# Patient Record
Sex: Female | Born: 1980 | Race: White | Hispanic: No | Marital: Single | Smoking: Current every day smoker
Health system: Southern US, Community
[De-identification: ages and names within clinical notes are randomized; demographics above are authoritative.]

## PROBLEM LIST (undated history)

## (undated) DIAGNOSIS — K219 Gastro-esophageal reflux disease without esophagitis: Secondary | ICD-10-CM

## (undated) DIAGNOSIS — K08109 Complete loss of teeth, unspecified cause, unspecified class: Secondary | ICD-10-CM

## (undated) DIAGNOSIS — Z972 Presence of dental prosthetic device (complete) (partial): Secondary | ICD-10-CM

## (undated) DIAGNOSIS — E0500 Thyrotoxicosis with diffuse goiter without thyrotoxic crisis or storm: Secondary | ICD-10-CM

## (undated) DIAGNOSIS — J45909 Unspecified asthma, uncomplicated: Secondary | ICD-10-CM

## (undated) DIAGNOSIS — M779 Enthesopathy, unspecified: Secondary | ICD-10-CM

## (undated) HISTORY — PX: MULTIPLE TOOTH EXTRACTIONS: SHX2053

## (undated) HISTORY — PX: DILATION AND CURETTAGE OF UTERUS: SHX78

---

## 2005-04-30 ENCOUNTER — Emergency Department: Payer: Self-pay | Admitting: Emergency Medicine

## 2005-12-31 ENCOUNTER — Emergency Department: Payer: Self-pay | Admitting: Unknown Physician Specialty

## 2006-01-05 ENCOUNTER — Emergency Department: Payer: Self-pay | Admitting: Emergency Medicine

## 2006-02-22 ENCOUNTER — Emergency Department: Payer: Self-pay | Admitting: Emergency Medicine

## 2006-04-07 ENCOUNTER — Emergency Department: Payer: Self-pay | Admitting: Emergency Medicine

## 2007-05-01 ENCOUNTER — Emergency Department: Payer: Self-pay | Admitting: Emergency Medicine

## 2007-05-01 ENCOUNTER — Other Ambulatory Visit: Payer: Self-pay

## 2008-02-03 ENCOUNTER — Emergency Department: Payer: Self-pay | Admitting: Internal Medicine

## 2008-03-17 ENCOUNTER — Emergency Department: Payer: Self-pay | Admitting: Emergency Medicine

## 2008-10-03 ENCOUNTER — Emergency Department: Payer: Self-pay | Admitting: Emergency Medicine

## 2010-03-07 ENCOUNTER — Emergency Department: Payer: Self-pay | Admitting: Emergency Medicine

## 2010-03-08 ENCOUNTER — Emergency Department: Payer: Self-pay | Admitting: Internal Medicine

## 2010-10-19 IMAGING — US US OB < 14 WEEKS - US OB TV
1 series · 17 of 28 positions shown · non-contrast
Comparison: none

REASON FOR EXAM: vag bleed
COMMENTS:

[Series 1: us ob < 14 weeks - us ob tv · 17 of 69 slices shown]
[im 1/69]
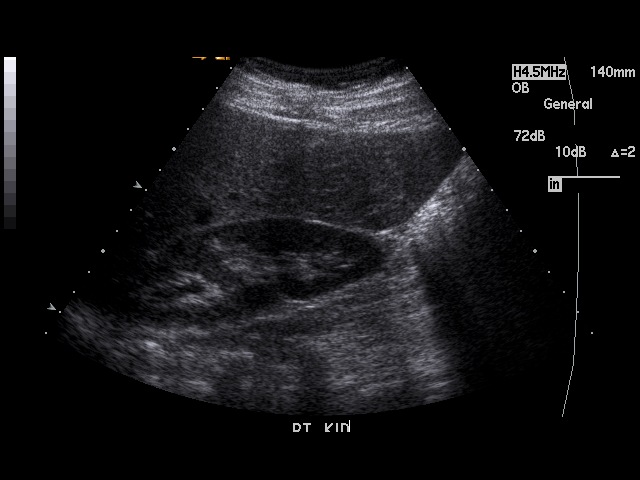
[im 6/69]
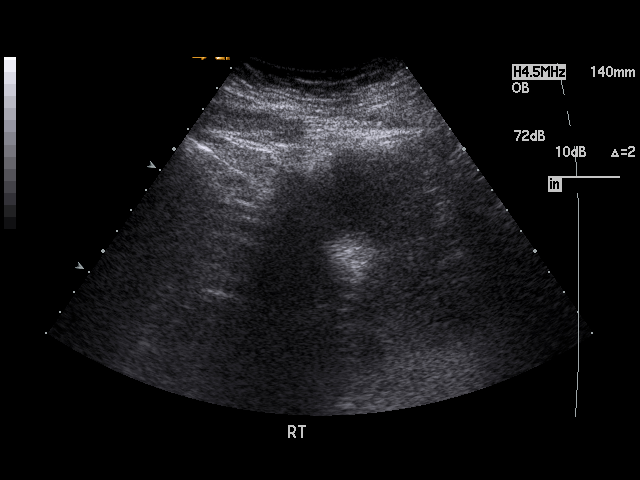
[im 11/69]
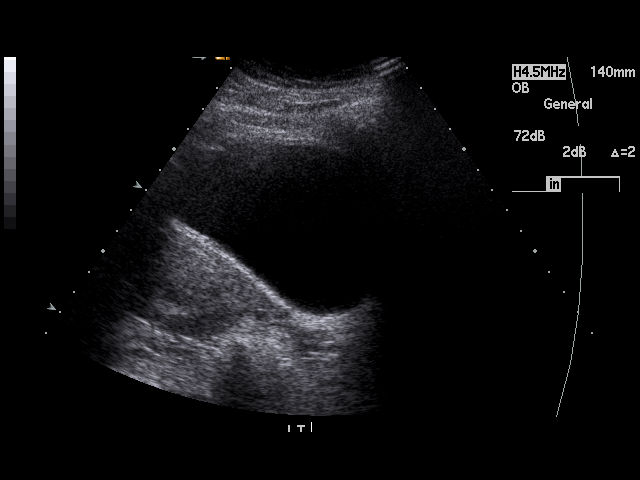
[im 13/69]
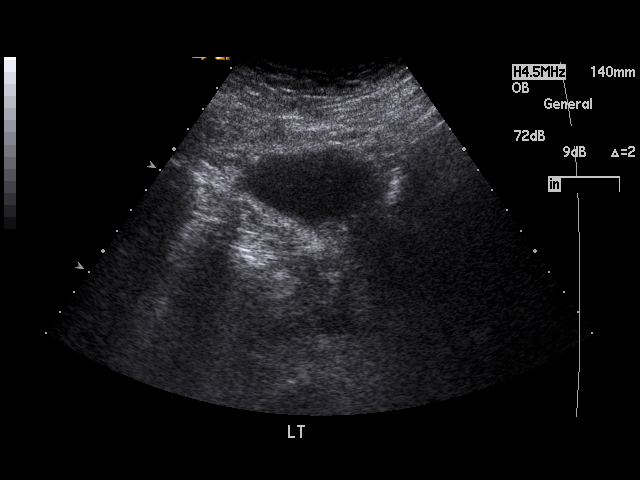
[im 18/69]
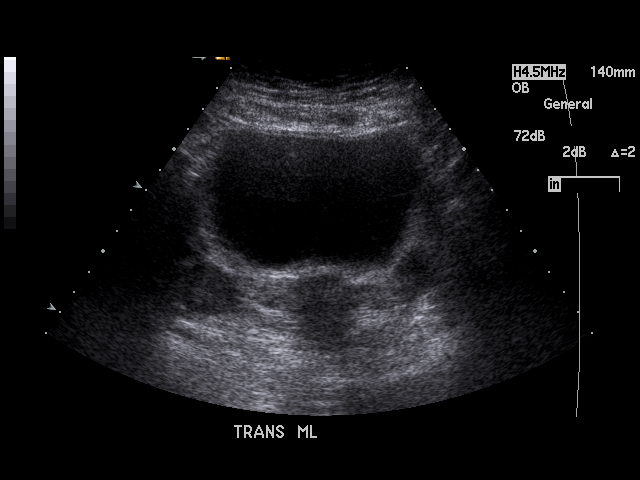
[im 23/69]
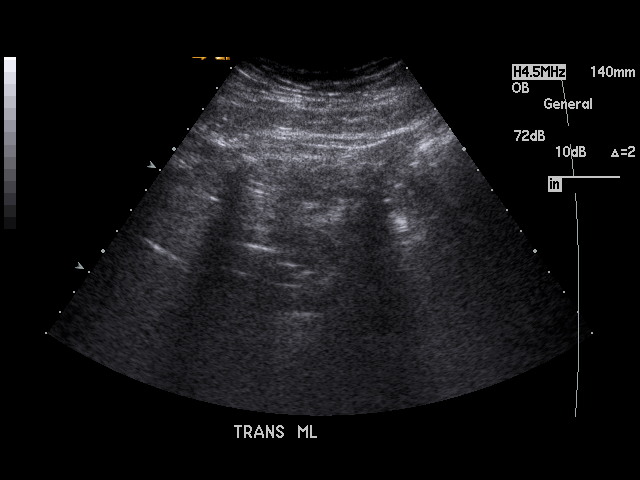
[im 26/69]
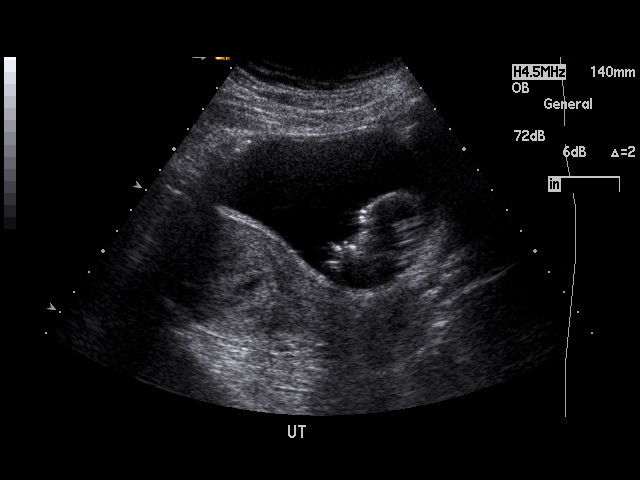
[im 31/69]
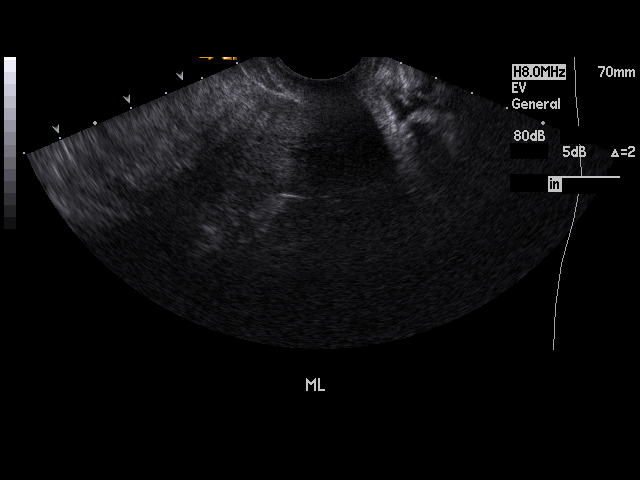
[im 36/69]
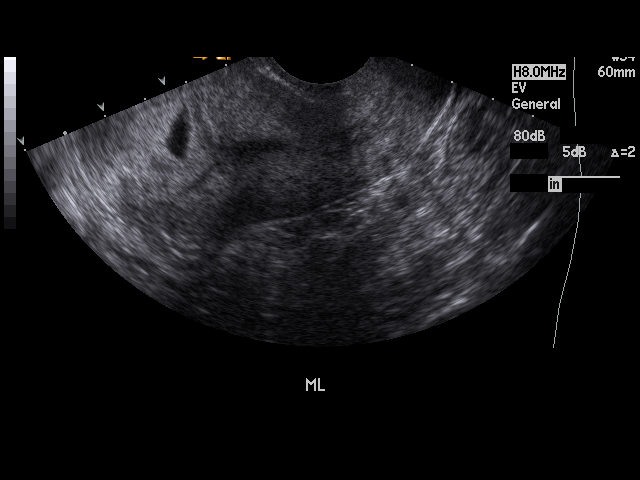
[im 38/69]
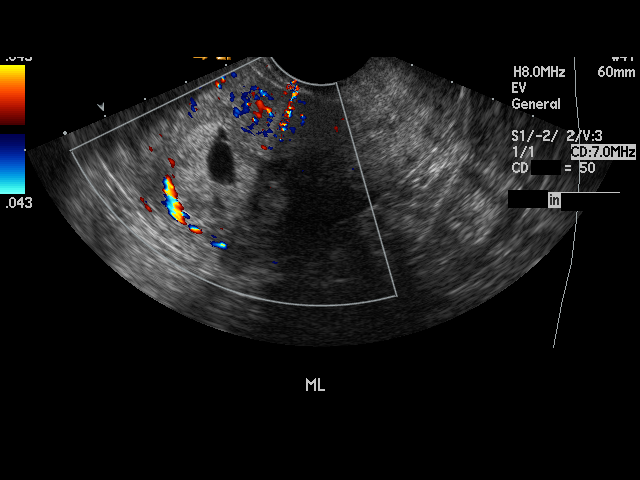
[im 43/69]
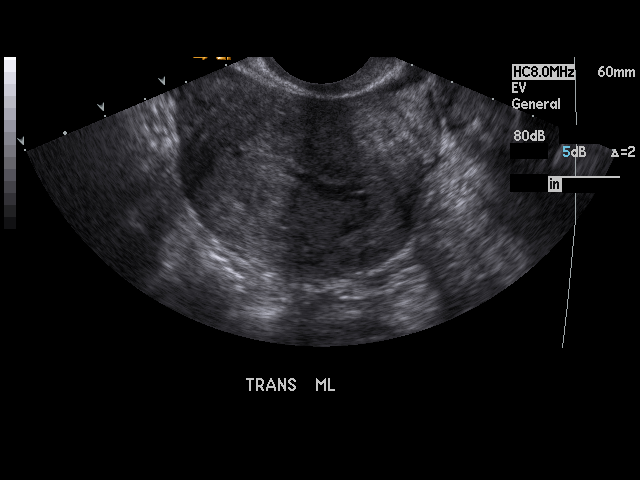
[im 46/69]
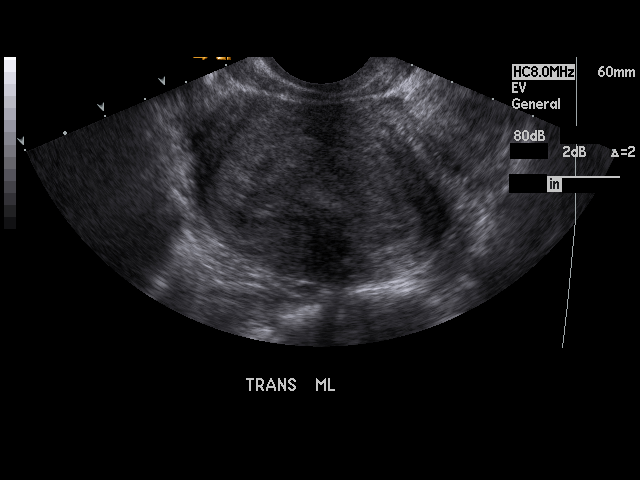
[im 51/69]
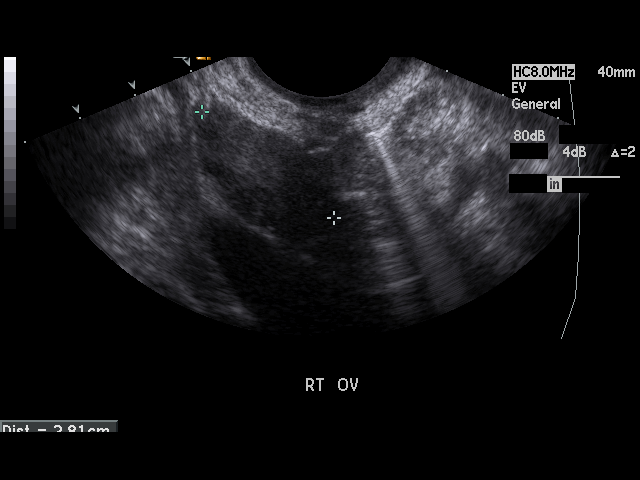
[im 56/69]
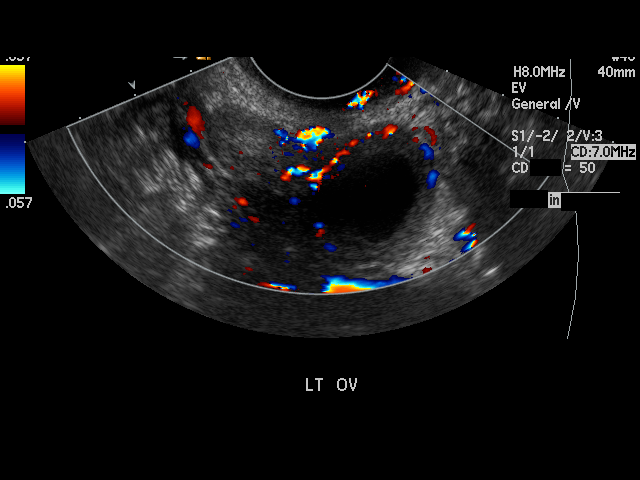
[im 58/69]
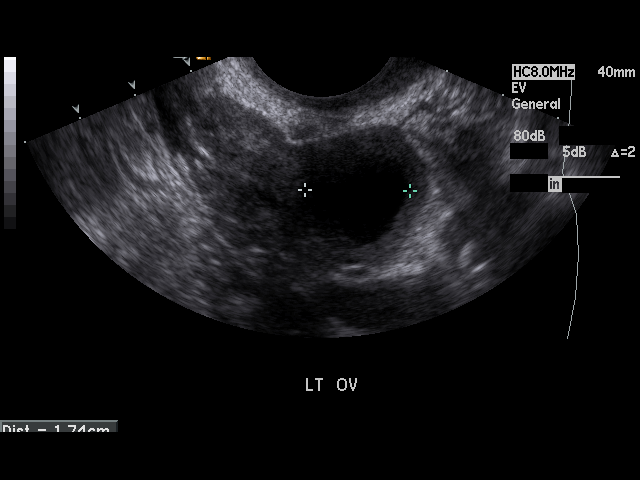
[im 63/69]
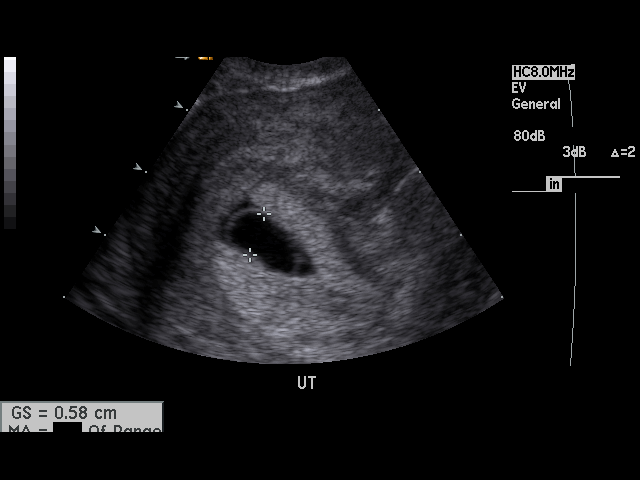
[im 69/69]
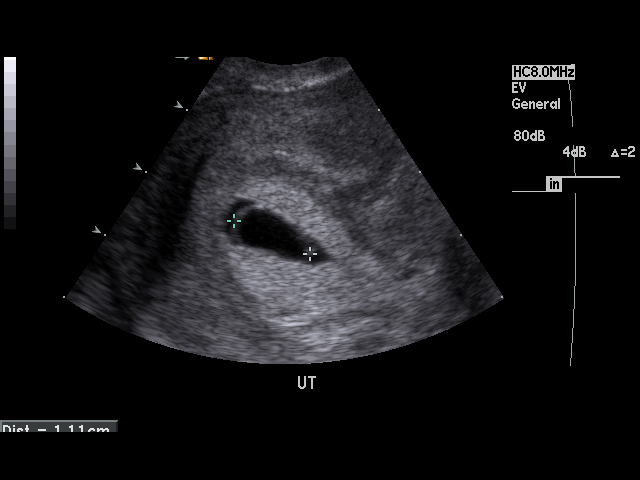

[17 of 28 positions shown; findings below may reference images not displayed]

PROCEDURE:     US  - US OB LESS THAN 14 WEEKS/W TRANS  - March 07, 2010 [DATE]

RESULT:     There is a cystic structure in the uterus which could represent
an intrauterine gestational sac of less than 5 weeks size. What appears to
represent a yolk sac is also seen and is slightly prominent for the size of
the gestational sac. No embryo within the gestational sac is seen. The
differential includes very early intrauterine gestation that is not yet
visualized, recent abortion or ectopic pregnancy with a pseudo-gestational
sac observed in the uterus. The right and left ovaries are visualized. There
is a 1.83 cm cyst of the left ovary. No free fluid is seen in the pelvis.
IMPRESSION: 1.  No intrauterine embryo is observed but there is noted what appears to
represent a less than 5 week size gestational sac. The differential is as
noted above.
2.  A left ovarian cyst is noted.
3.  No free fluid is noted in the pelvis.

## 2010-12-27 ENCOUNTER — Emergency Department: Payer: Self-pay | Admitting: Emergency Medicine

## 2011-04-12 ENCOUNTER — Emergency Department: Payer: Self-pay | Admitting: Emergency Medicine

## 2011-04-20 ENCOUNTER — Emergency Department: Payer: Self-pay | Admitting: Emergency Medicine

## 2011-10-30 ENCOUNTER — Inpatient Hospital Stay: Payer: Self-pay

## 2011-12-31 ENCOUNTER — Ambulatory Visit: Payer: Self-pay | Admitting: Obstetrics and Gynecology

## 2011-12-31 LAB — CBC
HGB: 12.4 g/dL (ref 12.0–16.0)
MCHC: 33.1 g/dL (ref 32.0–36.0)
Platelet: 276 10*3/uL (ref 150–440)
RBC: 4.18 10*6/uL (ref 3.80–5.20)
WBC: 7.9 10*3/uL (ref 3.6–11.0)

## 2011-12-31 LAB — PREGNANCY, URINE: Pregnancy Test, Urine: NEGATIVE m[IU]/mL

## 2012-01-01 ENCOUNTER — Ambulatory Visit: Payer: Self-pay | Admitting: Obstetrics and Gynecology

## 2012-01-05 LAB — PATHOLOGY REPORT

## 2012-03-20 ENCOUNTER — Emergency Department: Payer: Self-pay | Admitting: Emergency Medicine

## 2014-09-08 ENCOUNTER — Emergency Department: Payer: Self-pay | Admitting: Emergency Medicine

## 2015-04-01 NOTE — Op Note (Signed)
PATIENT NAME:  Holly Reynolds, [name] MR#:  [id number] DATE OF BIRTH:  1981-04-11  DATE OF PROCEDURE:  01/01/2012  PROCEDURE: Postpartum hysteroscopy D and C.  PREOPERATIVE DIAGNOSIS: Most likely retained placenta.   POSTOPERATIVE DIAGNOSIS: Most likely retained placenta.   ESTIMATED BLOOD LOSS: 50 mL.  SURGEON: Elliot Gurneyarrie C. Maysen Bonsignore, MD   PROCEDURE: The hymen was grasped and a side-opening speculum was placed into the vagina. The anterior lip of the cervix was grasped with a single-tooth tenaculum. The uterus was sounded to 8 cm. Cervix was dilated and then the camera with the dilator was placed and the aforementioned findings were seen after uterine distention. The hysteroscope was then removed. The suction and the curettage was then used to remove the implanted and calcified placenta from the lining. The patient tolerated the procedure well. Gritty feeling was felt on the uterus. Side-opening speculum and single-tooth tenaculum were removed. The patient tolerated the procedure well and was taken to the PAC-U.   ____________________________ Elliot Gurneyarrie C. Storie Heffern, MD cck:drc D: 01/01/2012 23:29:14 ET T: 01/02/2012 08:24:45 ET JOB#: [id number]  cc: Elliot Gurneyarrie C. Emry Tobin, MD, <Dictator> Elliot GurneyARRIE C Orella Cushman MD ELECTRONICALLY SIGNED 01/08/2012 7:42

## 2015-08-28 ENCOUNTER — Telehealth: Payer: Self-pay | Admitting: Gastroenterology

## 2015-08-28 NOTE — Telephone Encounter (Signed)
Left voice message for patient to call and schedule appointment for epigastric pain/loose stools one year with Dr. Servando Snare

## 2015-10-11 ENCOUNTER — Encounter: Payer: Self-pay | Admitting: Gastroenterology

## 2015-10-11 ENCOUNTER — Ambulatory Visit (INDEPENDENT_AMBULATORY_CARE_PROVIDER_SITE_OTHER): Payer: Medicaid Other | Admitting: Gastroenterology

## 2015-10-11 ENCOUNTER — Other Ambulatory Visit: Payer: Self-pay

## 2015-10-11 VITALS — BP 118/68 | HR 93 | Temp 98.4°F | Ht 64.0 in | Wt 135.0 lb

## 2015-10-11 DIAGNOSIS — G8929 Other chronic pain: Secondary | ICD-10-CM

## 2015-10-11 DIAGNOSIS — R1013 Epigastric pain: Secondary | ICD-10-CM | POA: Diagnosis not present

## 2015-10-11 MED ORDER — OMEPRAZOLE 40 MG PO CPDR: 40.0000 mg | DELAYED_RELEASE_CAPSULE | Freq: Every day | ORAL | Status: AC

## 2015-10-11 NOTE — Progress Notes (Signed)
Gastroenterology Consultation  Referring Provider:     No ref. provider found Primary Care Physician:  Imelda PillowHOLLAND, CHELSA, NP Primary Gastroenterologist:  Dr. Servando SnareWohl     Reason for Consultation:     Epigastric pain        HPI:   Holly Reynolds is a 34 y.o. y/o female referred for consultation & management of epigastric pain by Dr. Imelda PillowHOLLAND, CHELSA, NP.  This patient comes today reports that she's had months of epigastric pain. The patient underwent a ultrasound of her gallbladder which showed some sludge. There is no form stones but there was shadowing in the gallbladder. The patient reports that she has the symptoms a few times a month. She has been told to take a PPI in the past but from her past notes it appears that she has not done that. There is no report of any unexplained weight loss, nausea, fevers, chills, black stools or bloody stools. There is no family history of colon cancer colon polyps.  History reviewed. No pertinent past medical history.  History reviewed. No pertinent past surgical history.  Prior to Admission medications   Medication Sig Start Date End Date Taking? Authorizing Provider  omeprazole (PRILOSEC) 40 MG capsule Take 1 capsule (40 mg total) by mouth daily. 10/11/15   Midge Miniumarren Tristina Sahagian, MD    Family History  Problem Relation Age of Onset  . Arthritis Mother   . Hypertension Mother      Social History  Substance Use Topics  . Smoking status: Current Every Day Smoker  . Smokeless tobacco: Never Used  . Alcohol Use: No    Allergies as of 10/11/2015  . (No Known Allergies)    Review of Systems:    All systems reviewed and negative except where noted in HPI.   Physical Exam:  BP 118/68 mmHg  Pulse 93  Temp(Src) 98.4 F (36.9 C) (Oral)  Ht 5\' 4"  (1.626 m)  Wt 135 lb (61.236 kg)  BMI 23.16 kg/m2 No LMP recorded. Psych:  Alert and cooperative. Normal mood and affect. General:   Alert,  Well-developed, well-nourished, pleasant and cooperative in  NAD Head:  Normocephalic and atraumatic. Eyes:  Sclera clear, no icterus.   Conjunctiva pink. Ears:  Normal auditory acuity. Nose:  No deformity, discharge, or lesions. Mouth:  No deformity or lesions,oropharynx pink & moist. Neck:  Supple; no masses or thyromegaly. Lungs:  Respirations even and unlabored.  Clear throughout to auscultation.   No wheezes, crackles, or rhonchi. No acute distress. Heart:  Regular rate and rhythm; no murmurs, clicks, rubs, or gallops. Abdomen:  Normal bowel sounds.  No bruits.  Soft, mild epigastric tenderness and non-distended without masses, hepatosplenomegaly or hernias noted.  No guarding or rebound tenderness.  Negative Carnett sign.   Rectal:  Deferred.  Msk:  Symmetrical without gross deformities.  Good, equal movement & strength bilaterally. Pulses:  Normal pulses noted. Extremities:  No clubbing or edema.  No cyanosis. Neurologic:  Alert and oriented x3;  grossly normal neurologically. Skin:  Intact without significant lesions or rashes.  No jaundice. Lymph Nodes:  No significant cervical adenopathy. Psych:  Alert and cooperative. Normal mood and affect.  Imaging Studies: No results found.  Assessment and Plan:   Holly EveCatina L Verret is a 34 y.o. y/o female comes in today with epigastric pain that she states happens a few times a month. The patient will be put on a PPI daily. The patient will also be set up for an upper endoscopy due  to her symptoms and epigastric tenderness on her physical exam.I have discussed risks & benefits which include, but are not limited to, bleeding, infection, perforation & drug reaction.  The patient agrees with this plan & written consent will be obtained.      Note: This dictation was prepared with Dragon dictation along with smaller phrase technology. Any transcriptional errors that result from this process are unintentional.

## 2015-10-12 ENCOUNTER — Other Ambulatory Visit: Payer: Self-pay

## 2015-10-22 ENCOUNTER — Encounter: Payer: Self-pay | Admitting: *Deleted

## 2015-10-24 NOTE — Discharge Instructions (Signed)

## 2015-10-25 ENCOUNTER — Other Ambulatory Visit: Payer: Self-pay | Admitting: Gastroenterology

## 2015-10-25 ENCOUNTER — Ambulatory Visit: Payer: Medicaid Other | Admitting: Anesthesiology

## 2015-10-25 ENCOUNTER — Encounter
Admission: RE | Disposition: A | Discharge status: Home / Self Care | Payer: Self-pay | Source: Ambulatory Visit | Attending: Gastroenterology

## 2015-10-25 ENCOUNTER — Ambulatory Visit
Admission: RE | Admit: 2015-10-25 | Discharge: 2015-10-25 | Disposition: A | Discharge status: Home / Self Care | Payer: Medicaid Other | Source: Ambulatory Visit | Attending: Gastroenterology | Admitting: Gastroenterology

## 2015-10-25 DIAGNOSIS — R1013 Epigastric pain: Secondary | ICD-10-CM | POA: Diagnosis not present

## 2015-10-25 DIAGNOSIS — K219 Gastro-esophageal reflux disease without esophagitis: Secondary | ICD-10-CM | POA: Insufficient documentation

## 2015-10-25 DIAGNOSIS — K2950 Unspecified chronic gastritis without bleeding: Secondary | ICD-10-CM | POA: Diagnosis not present

## 2015-10-25 DIAGNOSIS — E0500 Thyrotoxicosis with diffuse goiter without thyrotoxic crisis or storm: Secondary | ICD-10-CM | POA: Insufficient documentation

## 2015-10-25 DIAGNOSIS — K2970 Gastritis, unspecified, without bleeding: Secondary | ICD-10-CM | POA: Insufficient documentation

## 2015-10-25 DIAGNOSIS — J45909 Unspecified asthma, uncomplicated: Secondary | ICD-10-CM | POA: Diagnosis not present

## 2015-10-25 DIAGNOSIS — Z8261 Family history of arthritis: Secondary | ICD-10-CM | POA: Diagnosis not present

## 2015-10-25 DIAGNOSIS — Z9889 Other specified postprocedural states: Secondary | ICD-10-CM | POA: Insufficient documentation

## 2015-10-25 DIAGNOSIS — Z8249 Family history of ischemic heart disease and other diseases of the circulatory system: Secondary | ICD-10-CM | POA: Insufficient documentation

## 2015-10-25 DIAGNOSIS — F17210 Nicotine dependence, cigarettes, uncomplicated: Secondary | ICD-10-CM | POA: Insufficient documentation

## 2015-10-25 DIAGNOSIS — Z79899 Other long term (current) drug therapy: Secondary | ICD-10-CM | POA: Diagnosis not present

## 2015-10-25 HISTORY — PX: ESOPHAGOGASTRODUODENOSCOPY (EGD) WITH PROPOFOL: SHX5813

## 2015-10-25 HISTORY — DX: Complete loss of teeth, unspecified cause, unspecified class: K08.109

## 2015-10-25 HISTORY — DX: Unspecified asthma, uncomplicated: J45.909

## 2015-10-25 HISTORY — DX: Gastro-esophageal reflux disease without esophagitis: K21.9

## 2015-10-25 HISTORY — DX: Enthesopathy, unspecified: M77.9

## 2015-10-25 HISTORY — DX: Complete loss of teeth, unspecified cause, unspecified class: Z97.2

## 2015-10-25 HISTORY — DX: Thyrotoxicosis with diffuse goiter without thyrotoxic crisis or storm: E05.00

## 2015-10-25 SURGERY — ESOPHAGOGASTRODUODENOSCOPY (EGD) WITH PROPOFOL
Anesthesia: Monitor Anesthesia Care | Wound class: Clean Contaminated

## 2015-10-25 MED ORDER — LACTATED RINGERS IV SOLN
INTRAVENOUS | Status: DC
  Administered 2015-10-25: 09:00:00 via INTRAVENOUS

## 2015-10-25 MED ORDER — ACETAMINOPHEN 325 MG PO TABS: 325.0000 mg | ORAL_TABLET | ORAL | Status: DC | PRN

## 2015-10-25 MED ORDER — PROPOFOL 10 MG/ML IV BOLUS
INTRAVENOUS | Status: DC | PRN
  Administered 2015-10-25: 100 mg via INTRAVENOUS
  Administered 2015-10-25: 50 mg via INTRAVENOUS

## 2015-10-25 MED ORDER — LIDOCAINE HCL (CARDIAC) 20 MG/ML IV SOLN
INTRAVENOUS | Status: DC | PRN
  Administered 2015-10-25: 20 mg via INTRAVENOUS

## 2015-10-25 MED ORDER — ACETAMINOPHEN 160 MG/5ML PO SOLN: 325.0000 mg | ORAL | Status: DC | PRN

## 2015-10-25 MED ORDER — STERILE WATER FOR IRRIGATION IR SOLN
Status: DC | PRN
  Administered 2015-10-25: 09:00:00

## 2015-10-25 SURGICAL SUPPLY — 39 items

## 2015-10-25 NOTE — Op Note (Signed)
[facility] Gastroenterology Patient Name: Holly SchleinCatina Reynolds Procedure Date: 10/25/2015 8:50 AM MRN: [id number] Account #: [id number] Date of Birth: 11/26/1981 Admit Type: Outpatient Age: 34 [facility] 01 Gender: Female Note Status: Finalized Procedure:         Upper GI endoscopy Indications:       Epigastric abdominal pain Providers:         Midge Miniumarren Daneil Beem, MD Referring MD:      Resa Minerhelsa B. Marcelle OverlieHolland (Referring MD) Medicines:         Propofol per Anesthesia Complications:     No immediate complications. Procedure:         Pre-Anesthesia Assessment:                    - Prior to the procedure, a History and Physical was                     performed, and patient medications and allergies were                     reviewed. The patient's tolerance of previous anesthesia                     was also reviewed. The risks and benefits of the procedure                     and the sedation options and risks were discussed with the                     patient. All questions were answered, and informed consent                     was obtained. Prior Anticoagulants: The patient has taken                     no previous anticoagulant or antiplatelet agents. ASA                     Grade Assessment: II - A patient with mild systemic                     disease. After reviewing the risks and benefits, the                     patient was deemed in satisfactory condition to undergo                     the procedure.                    After obtaining informed consent, the endoscope was passed                     under direct vision. Throughout the procedure, the                     patient's blood pressure, pulse, and oxygen saturations                     were monitored continuously. The Olympus GIF H180J                     endoscope (S#: [id number]) was introduced through the mouth,  and advanced to the second part of duodenum. The upper GI   endoscopy was accomplished without difficulty. The patient                     tolerated the procedure well. Findings:      The examined esophagus was normal.      Localized minimal inflammation characterized by erythema was found in       the gastric antrum. Biopsies were taken with a cold forceps for       histology.      The examined duodenum was normal. Impression:        - Normal esophagus.                    - Gastritis. Biopsied.                    - Normal examined duodenum. Recommendation:    - Await pathology results. Procedure Code(s): --- Professional ---                    [phone], Esophagogastroduodenoscopy, flexible, transoral;                     with biopsy, single or multiple Diagnosis Code(s): --- Professional ---                    R10.13, Epigastric pain                    K29.70, Gastritis, unspecified, without bleeding CPT copyright 2014 American Medical Association. All rights reserved. The codes documented in this report are preliminary and upon coder review may  be revised to meet current compliance requirements. Midge Minium, MD 10/25/2015 9:04:07 AM This report has been signed electronically. Number of Addenda: 0 Note Initiated On: 10/25/2015 8:50 AM Total Procedure Duration: 0 hours 2 minutes 36 seconds       [facility]

## 2015-10-25 NOTE — Transfer of Care (Signed)
Immediate Anesthesia Transfer of Care Note  Patient: Holly Reynolds  Procedure(s) Performed: Procedure(s): ESOPHAGOGASTRODUODENOSCOPY (EGD) WITH PROPOFOL (N/A)  Patient Location: PACU  Anesthesia Type: MAC  Level of Consciousness: awake, alert  and patient cooperative  Airway and Oxygen Therapy: Patient Spontanous Breathing and Patient connected to supplemental oxygen  Post-op Assessment: Post-op Vital signs reviewed, Patient's Cardiovascular Status Stable, Respiratory Function Stable, Patent Airway and No signs of Nausea or vomiting  Post-op Vital Signs: Reviewed and stable  Complications: No apparent anesthesia complications

## 2015-10-25 NOTE — Anesthesia Procedure Notes (Signed)
Procedure Name: MAC Performed by: Darrin Koman Pre-anesthesia Checklist: Patient identified, Emergency Drugs available, Suction available, Patient being monitored and Timeout performed Patient Re-evaluated:Patient Re-evaluated prior to inductionOxygen Delivery Method: Nasal cannula       

## 2015-10-25 NOTE — Anesthesia Postprocedure Evaluation (Signed)
  Anesthesia Post-op Note  Patient: Holly Reynolds  Procedure(s) Performed: Procedure(s): ESOPHAGOGASTRODUODENOSCOPY (EGD) WITH PROPOFOL (N/A)  Anesthesia type:MAC  Patient location: PACU  Post pain: Pain level controlled  Post assessment: Post-op Vital signs reviewed, Patient's Cardiovascular Status Stable, Respiratory Function Stable, Patent Airway and No signs of Nausea or vomiting  Post vital signs: Reviewed and stable  Last Vitals:  Filed Vitals:   10/25/15 0909  BP: 94/59  Pulse: 76  Temp: 36.7 C  Resp: 16    Level of consciousness: awake, alert  and patient cooperative  Complications: No apparent anesthesia complications

## 2015-10-25 NOTE — H&P (Signed)
  [facility]  [address] [place], [place] [address] Phone: [phone] Fax : [phone]  Primary Care Physician:  Imelda PillowHOLLAND, CHELSA, NP Primary Gastroenterologist:  Dr. Servando SnareWohl  Pre-Procedure History & Physical: HPI:  Holly EveCatina L Olivar is a 34 y.o. female is here for an endoscopy.   Past Medical History  Diagnosis Date  . GERD (gastroesophageal reflux disease)   . Full dentures     upper and lower.  Doesn't wear.  . Asthma     as child. "outgrew"  . Tendonitis     elbows, hands  . Graves disease     Pt reports "fluctuates between hyper and hypo"    Past Surgical History  Procedure Laterality Date  . Multiple tooth extractions    . Dilation and curettage of uterus      Prior to Admission medications   Medication Sig Start Date End Date Taking? Authorizing Provider  omeprazole (PRILOSEC) 40 MG capsule Take 1 capsule (40 mg total) by mouth daily. 10/11/15  Yes Midge Miniumarren Daril Warga, MD    Allergies as of 10/12/2015  . (No Known Allergies)    Family History  Problem Relation Age of Onset  . Arthritis Mother   . Hypertension Mother     Social History   Social History  . Marital Status: Single    Spouse Name: N/A  . Number of Children: N/A  . Years of Education: N/A   Occupational History  . Not on file.   Social History Main Topics  . Smoking status: Current Every Day Smoker -- 0.50 packs/day for 20 years    Types: Cigarettes  . Smokeless tobacco: Never Used  . Alcohol Use: No  . Drug Use: No  . Sexual Activity: Not on file   Other Topics Concern  . Not on file   Social History Narrative    Review of Systems: See HPI, otherwise negative ROS  Physical Exam: BP 112/65 mmHg  Pulse 78  Temp(Src) 98.8 F (37.1 C) (Temporal)  Resp 16  Ht 5\' 4"  (1.626 m)  Wt 136 lb (61.689 kg)  BMI 23.33 kg/m2  SpO2 100%  LMP 10/12/2015 (Exact Date) General:   Alert,  pleasant and cooperative in NAD Head:  Normocephalic and atraumatic. Neck:   Supple; no masses or thyromegaly. Lungs:  Clear throughout to auscultation.    Heart:  Regular rate and rhythm. Abdomen:  Soft, nontender and nondistended. Normal bowel sounds, without guarding, and without rebound.   Neurologic:  Alert and  oriented x4;  grossly normal neurologically.  Impression/Plan: Holly Reynolds is here for an endoscopy to be performed for epigastric pain.  Risks, benefits, limitations, and alternatives regarding  endoscopy have been reviewed with the patient.  Questions have been answered.  All parties agreeable.   Darlina Rumpfaren Abbie Jablon, MD  10/25/2015, 8:26 AM

## 2015-10-25 NOTE — Anesthesia Preprocedure Evaluation (Signed)
Anesthesia Evaluation  Patient identified by MRN, date of birth, ID band  Reviewed: Allergy & Precautions, H&P , NPO status , Patient's Chart, lab work & pertinent test results  Airway Mallampati: I  TM Distance: >3 FB Neck ROM: full    Dental  (+) Edentulous Lower, Edentulous Upper   Pulmonary asthma , Current Smoker,    Pulmonary exam normal        Cardiovascular  Rhythm:regular Rate:Normal     Neuro/Psych    GI/Hepatic   Endo/Other    Renal/GU      Musculoskeletal   Abdominal   Peds  Hematology   Anesthesia Other Findings   Reproductive/Obstetrics                             Anesthesia Physical Anesthesia Plan  ASA: II  Anesthesia Plan: MAC   Post-op Pain Management:    Induction:   Airway Management Planned:   Additional Equipment:   Intra-op Plan:   Post-operative Plan:   Informed Consent: I have reviewed the patients History and Physical, chart, labs and discussed the procedure including the risks, benefits and alternatives for the proposed anesthesia with the patient or authorized representative who has indicated his/her understanding and acceptance.     Plan Discussed with: CRNA  Anesthesia Plan Comments:         Anesthesia Quick Evaluation

## 2015-10-26 ENCOUNTER — Encounter: Payer: Self-pay | Admitting: Gastroenterology

## 2015-10-29 ENCOUNTER — Emergency Department
Admission: EM | Admit: 2015-10-29 | Discharge: 2015-10-29 | Disposition: A | Discharge status: Home / Self Care | Payer: Medicaid Other | Attending: Emergency Medicine | Admitting: Emergency Medicine

## 2015-10-29 ENCOUNTER — Encounter: Payer: Self-pay | Admitting: Emergency Medicine

## 2015-10-29 ENCOUNTER — Emergency Department: Payer: Medicaid Other

## 2015-10-29 ENCOUNTER — Telehealth: Payer: Self-pay

## 2015-10-29 DIAGNOSIS — F17210 Nicotine dependence, cigarettes, uncomplicated: Secondary | ICD-10-CM | POA: Diagnosis not present

## 2015-10-29 DIAGNOSIS — J45901 Unspecified asthma with (acute) exacerbation: Secondary | ICD-10-CM | POA: Insufficient documentation

## 2015-10-29 DIAGNOSIS — F17200 Nicotine dependence, unspecified, uncomplicated: Secondary | ICD-10-CM

## 2015-10-29 DIAGNOSIS — R0981 Nasal congestion: Secondary | ICD-10-CM | POA: Diagnosis present

## 2015-10-29 DIAGNOSIS — Z79899 Other long term (current) drug therapy: Secondary | ICD-10-CM | POA: Diagnosis not present

## 2015-10-29 DIAGNOSIS — J069 Acute upper respiratory infection, unspecified: Secondary | ICD-10-CM | POA: Diagnosis not present

## 2015-10-29 MED ORDER — ALBUTEROL SULFATE HFA 108 (90 BASE) MCG/ACT IN AERS: 2.0000 | INHALATION_SPRAY | Freq: Four times a day (QID) | RESPIRATORY_TRACT | Status: DC | PRN

## 2015-10-29 NOTE — Telephone Encounter (Signed)
-----   Message from Darren Wohl, MD sent at 10/29/2015 12:47 PM EST ----- Please have the patient come in for a follow up. 

## 2015-10-29 NOTE — ED Notes (Signed)
Complaining of symptom onset on Friday with sore throat. Sore throat resolved, now having chills, body aches, cough, sneezing.

## 2015-10-29 NOTE — Telephone Encounter (Signed)
LVM for pt to return my call to schedule a follow up appt to discuss EGD results.  

## 2015-10-29 NOTE — ED Notes (Signed)
States she has had some nasal and chest congestion for several days  Occasional fever.no cough noted at present..Marland Kitchen

## 2015-10-29 NOTE — ED Provider Notes (Signed)
[facility] Emergency Department Provider Note  ____________________________________________  Time seen: Approximately 10:18 AM  I have reviewed the triage vital signs and the nursing notes.   HISTORY  Chief Complaint Nasal Congestion; Chills; and Cough  HPI Holly Reynolds is a 34 y.o. female is here with complaint of nasal and chest congestion for several days. She states she has not occasional cough and some sore throat. She complains also of chills, body aches, and sneezing. She has been taking over-the-counter cough medication without any relief. She states that she smokes approximately one pack cigarettes per day. She also reports that she has history of bronchitis" at least once a year". Coming into the emergency room carrying her child she states that she was short of breath.   Past Medical History  Diagnosis Date  . GERD (gastroesophageal reflux disease)   . Full dentures     upper and lower.  Doesn't wear.  . Asthma     as child. "outgrew"  . Tendonitis     elbows, hands  . Graves disease     Pt reports "fluctuates between hyper and hypo"    Patient Active Problem List   Diagnosis Date Noted  . Abdominal pain, epigastric   . Gastritis     Past Surgical History  Procedure Laterality Date  . Multiple tooth extractions    . Dilation and curettage of uterus    . Esophagogastroduodenoscopy (egd) with propofol N/A 10/25/2015    Procedure: ESOPHAGOGASTRODUODENOSCOPY (EGD) WITH PROPOFOL;  Surgeon: Midge Miniumarren Wohl, MD;  [facility];  Service: Endoscopy;  Laterality: N/A;    Current Outpatient Rx  Name  Route  Sig  Dispense  Refill  . albuterol (PROVENTIL HFA;VENTOLIN HFA) 108 (90 BASE) MCG/ACT inhaler   Inhalation   Inhale 2 puffs into the lungs every 6 (six) hours as needed for wheezing or shortness of breath.   1 Inhaler   2   . omeprazole (PRILOSEC) 40 MG capsule   Oral   Take 1 capsule (40 mg total) by mouth daily.  30 capsule   3     Allergies Review of patient's allergies indicates no known allergies.  Family History  Problem Relation Age of Onset  . Arthritis Mother   . Hypertension Mother     Social History Social History  Substance Use Topics  . Smoking status: Current Every Day Smoker -- 0.50 packs/day for 20 years    Types: Cigarettes  . Smokeless tobacco: Never Used  . Alcohol Use: No    Review of Systems Constitutional: Positive fever/chills Eyes: No visual changes. ENT: No sore throat. Cardiovascular: Denies chest pain. Respiratory: Positive shortness of breath. Positive cough. Gastrointestinal: No abdominal pain.  No nausea, no vomiting.  Genitourinary: Negative for dysuria. Musculoskeletal: Negative for back pain. Skin: Negative for rash. Neurological: Negative for headaches, focal weakness or numbness.  10-point ROS otherwise negative.  ____________________________________________   PHYSICAL EXAM:  VITAL SIGNS: ED Triage Vitals  Enc Vitals Group     BP 10/29/15 0932 101/65 mmHg     Pulse Rate 10/29/15 0932 103     Resp 10/29/15 0932 20     Temp 10/29/15 0932 98.1 F (36.7 C)     Temp Source 10/29/15 0932 Oral     SpO2 10/29/15 0932 97 %     Weight 10/29/15 0932 135 lb (61.236 kg)     Height 10/29/15 0932 5\' 4"  (1.626 m)     Head Cir --  Peak Flow --      Pain Score --      Pain Loc --      Pain Edu? --      Excl. in GC? --     Constitutional: Alert and oriented. Well appearing and in no acute distress. Eyes: Conjunctivae are normal. PERRL. EOMI. Head: Atraumatic. Nose: No congestion/rhinnorhea. Mouth/Throat: Mucous membranes are moist.  Oropharynx non-erythematous. Neck: No stridor.  Supple Hematological/Lymphatic/Immunilogical: No cervical lymphadenopathy. Cardiovascular: Normal rate, regular rhythm. Grossly normal heart sounds.  Good peripheral circulation. Respiratory: Normal respiratory effort.  No retractions. Lungs faint occasional  expiratory wheeze heard throughout. No rales or rhonchi was noted. Patient is talking in complete sentences with out respiratory difficulty. Gastrointestinal: Soft and nontender. No distention.  Musculoskeletal: No lower extremity tenderness nor edema.  No joint effusions. Neurologic:  Normal speech and language. No gross focal neurologic deficits are appreciated. No gait instability. Skin:  Skin is warm, dry and intact. No rash noted. Psychiatric: Mood and affect are normal. Speech and behavior are normal.  ____________________________________________   LABS (all labs ordered are listed, but only abnormal results are displayed)  Labs Reviewed - No data to display  RADIOLOGY  Chest x-ray per radiologist shows no active cardiopulmonary disease. ____________________________________________   PROCEDURES  Procedure(s) performed: None  Critical Care performed: No  ____________________________________________   INITIAL IMPRESSION / ASSESSMENT AND PLAN / ED COURSE  Pertinent labs & imaging results that were available during my care of the patient were reviewed by me and considered in my medical decision making (see chart for details).  Patient is given a prescription for albuterol inhaler. She was told that this most likely was a viral respiratory infection and antibiotics would not help her. She is continue using her inhaler and follow-up with her primary care doctor. She'll return to the emergency room if any severe worsening of her symptoms. ____________________________________________   FINAL CLINICAL IMPRESSION(S) / ED DIAGNOSES  Final diagnoses:  Acute upper respiratory infection  Current smoker      Tommi Rumps, PA-C 10/29/15 1809  Jene Every, MD 10/30/15 1218

## 2015-10-30 ENCOUNTER — Telehealth: Payer: Self-pay | Admitting: Gastroenterology

## 2015-10-30 NOTE — Telephone Encounter (Signed)
Left voice message for patient to call and set up appointment with Dr. Servando SnareWohl for results

## 2015-10-30 NOTE — Telephone Encounter (Signed)
Pt returned call but left a voicemail. Trudie ReedShaunna returned her call but had to leave another vm.

## 2015-11-12 NOTE — Telephone Encounter (Signed)
Pt has been scheduled for an appt with Wohl on 11/20/15.

## 2015-11-20 ENCOUNTER — Encounter: Payer: Self-pay | Admitting: Gastroenterology

## 2015-11-20 ENCOUNTER — Ambulatory Visit (INDEPENDENT_AMBULATORY_CARE_PROVIDER_SITE_OTHER): Payer: Medicaid Other | Admitting: Gastroenterology

## 2015-11-20 ENCOUNTER — Other Ambulatory Visit: Payer: Self-pay

## 2015-11-20 DIAGNOSIS — D510 Vitamin B12 deficiency anemia due to intrinsic factor deficiency: Secondary | ICD-10-CM

## 2015-11-20 NOTE — Progress Notes (Signed)
   Primary Care Physician: Imelda PillowHOLLAND, CHELSA, NP  Primary Gastroenterologist:  Dr. Midge Miniumarren Tamara Monteith  Chief Complaint  Patient presents with  . Results    EGD and pathology    HPI: Holly Reynolds is a 34 y.o. female here for follow-up after having an upper endoscopy. The patient reports that since having started on her medication her pain has been much better. The patient reports that when the pain starts she just stopped eating and the pain goes away before it gets bad. The patient did have biopsies of the stomach at the time of the upper endoscopy. The biopsy showed the patient to have intestinal metaplasia in the stomach and atrophy.  Current Outpatient Prescriptions  Medication Sig Dispense Refill  . omeprazole (PRILOSEC) 40 MG capsule Take 1 capsule (40 mg total) by mouth daily. 30 capsule 3   No current facility-administered medications for this visit.    Allergies as of 11/20/2015  . (No Known Allergies)    ROS:  General: Negative for anorexia, weight loss, fever, chills, fatigue, weakness. ENT: Negative for hoarseness, difficulty swallowing , nasal congestion. CV: Negative for chest pain, angina, palpitations, dyspnea on exertion, peripheral edema.  Respiratory: Negative for dyspnea at rest, dyspnea on exertion, cough, sputum, wheezing.  GI: See history of present illness. GU:  Negative for dysuria, hematuria, urinary incontinence, urinary frequency, nocturnal urination.  Endo: Negative for unusual weight change.    Physical Examination:   BP 102/68 mmHg  Pulse 88  Temp(Src) 98.7 F (37.1 C) (Oral)  Ht 5\' 4"  (1.626 m)  Wt 134 lb 9.6 oz (61.054 kg)  BMI 23.09 kg/m2  LMP 11/14/2015  General: Well-nourished, well-developed in no acute distress.  Eyes: No icterus. Conjunctivae pink. Neuro: Alert and oriented x 3.  Grossly intact. Skin: Warm and dry, no jaundice.   Psych: Alert and cooperative, normal mood and affect.  Labs:    Imaging Studies: Dg Chest 2  View  10/29/2015  CLINICAL DATA:  Cough, fever, sore throat EXAM: CHEST  2 VIEW COMPARISON:  None. FINDINGS: The heart size and mediastinal contours are within normal limits. Both lungs are clear. The visualized skeletal structures are unremarkable. IMPRESSION: No active cardiopulmonary disease. Electronically Signed   By: Natasha MeadLiviu  Pop M.D.   On: 10/29/2015 10:55    Assessment and Plan:   Holly EveCatina L Tusing is a 34 y.o. y/o female who had an upper endoscopy with biopsy showing intestinal metaplasia of the stomach and atrophy. The patient states her symptoms are much better. The patient will have her blood sent off for B12 and for parietal cell antibodies to rule out pernicious anemia. The patient has been explained the plan and agrees with it.   Note: This dictation was prepared with Dragon dictation along with smaller phrase technology. Any transcriptional errors that result from this process are unintentional.

## 2015-11-22 ENCOUNTER — Telehealth: Payer: Self-pay

## 2015-11-22 LAB — VITAMIN B12: Vitamin B-12: 425 pg/mL (ref 211–946)

## 2015-11-22 LAB — ANTI-PARIETAL ANTIBODY: Parietal Cell Ab: 2.2 U (ref 0.0–20.0)

## 2015-11-22 NOTE — Telephone Encounter (Signed)
LVM letting pt know per Dr. Servando SnareWohl the vitamin B12 lab was normal.

## 2015-11-27 ENCOUNTER — Telehealth: Payer: Self-pay

## 2015-11-27 NOTE — Telephone Encounter (Signed)
LVM letting pt know her lab was negative. Advised her to call if she has any questions.

## 2015-11-27 NOTE — Telephone Encounter (Signed)
-----   Message from Midge Miniumarren Wohl, MD sent at 11/27/2015  7:00 AM EST ----- Let the patient know the blood test for pernious anemia was negative.

## 2015-12-11 ENCOUNTER — Telehealth: Payer: Self-pay | Admitting: Gastroenterology

## 2015-12-11 NOTE — Telephone Encounter (Signed)
Pt notified of lab results

## 2015-12-11 NOTE — Telephone Encounter (Signed)
Patient left a voice message Sunday that she never received her test results. She would like for you to call her.

## 2016-06-11 IMAGING — CR DG CHEST 2V
2 series · 2 of 2 positions shown · non-contrast
Comparison: None.

CLINICAL DATA: Cough, fever, sore throat

EXAM:
CHEST  2 VIEW

[chest pa]
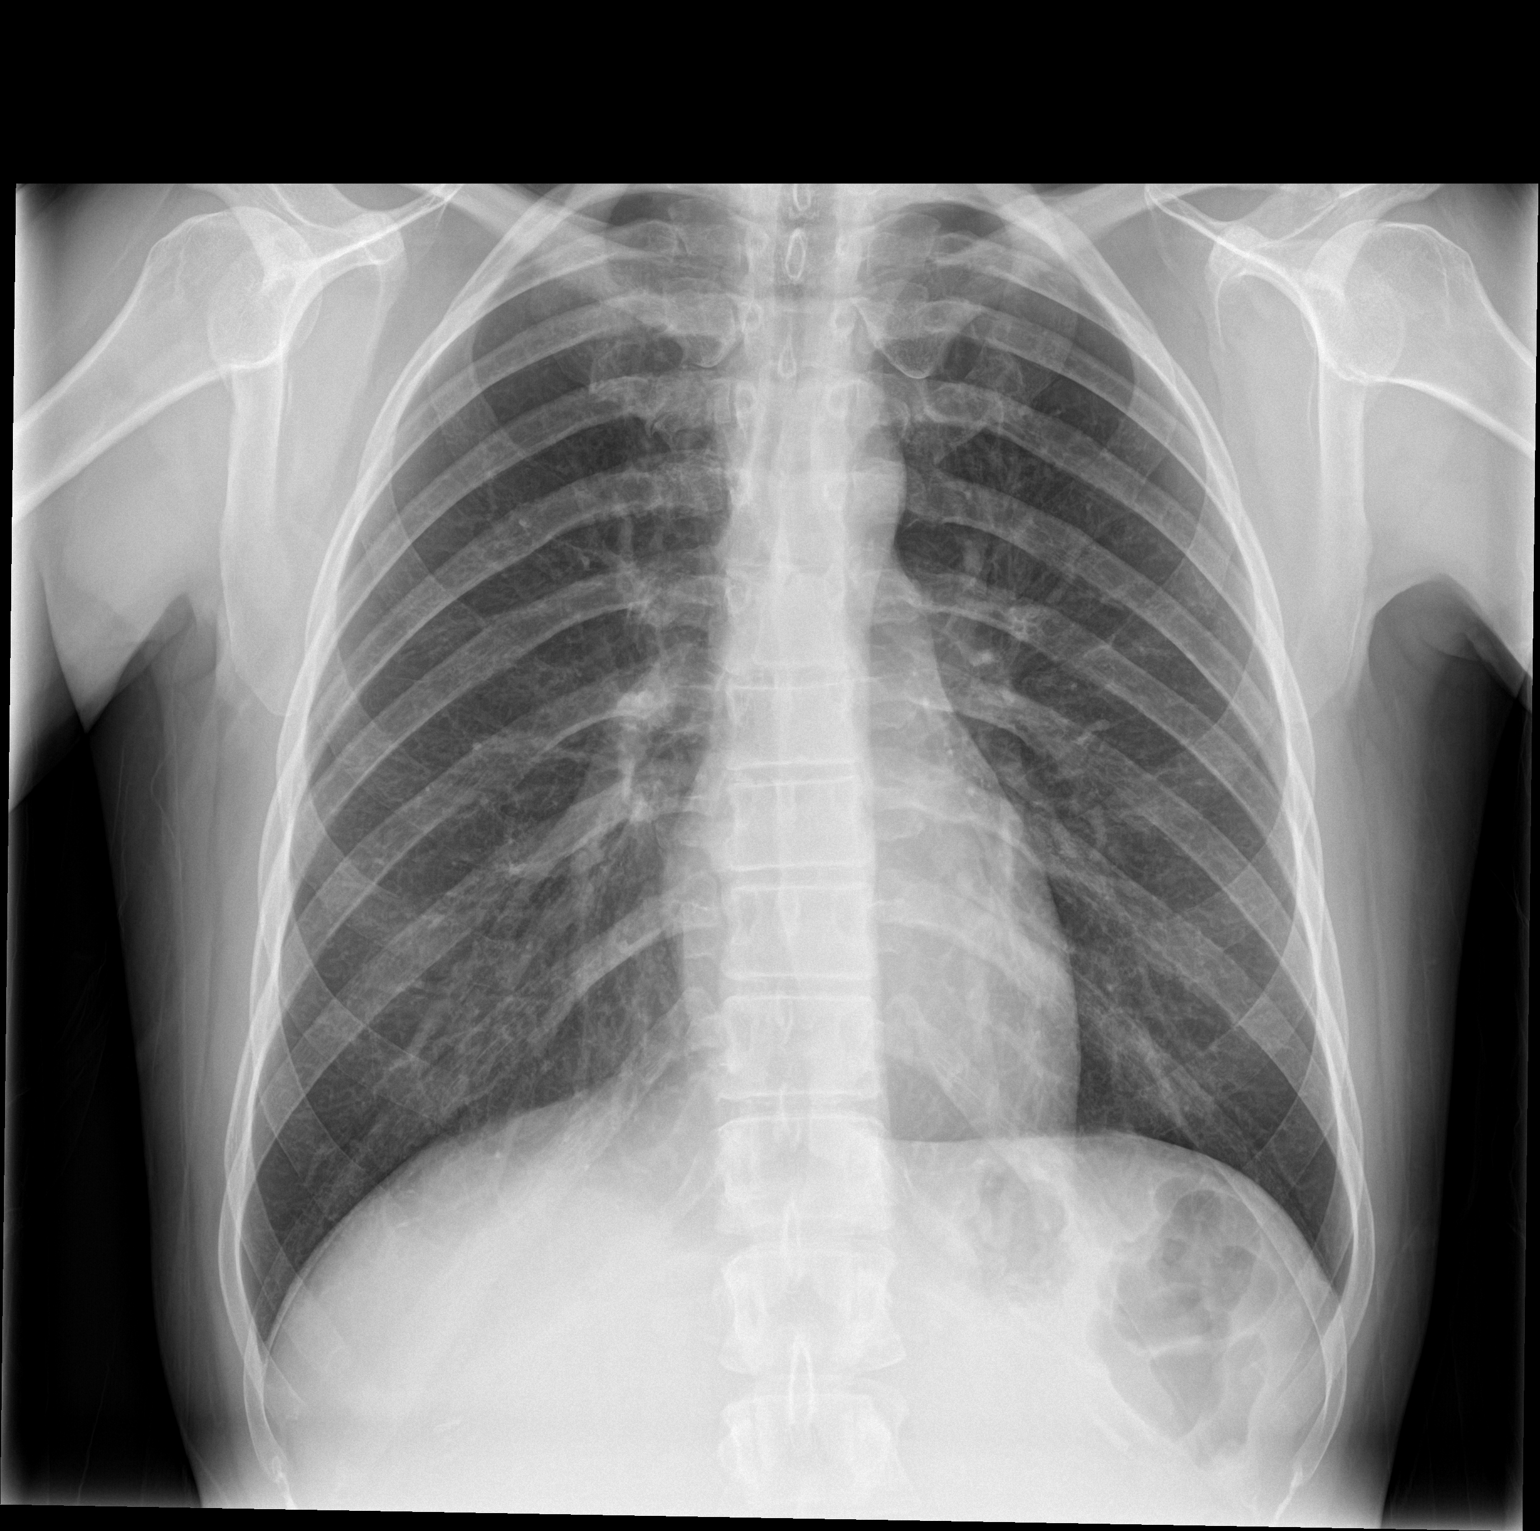

[chest lat]
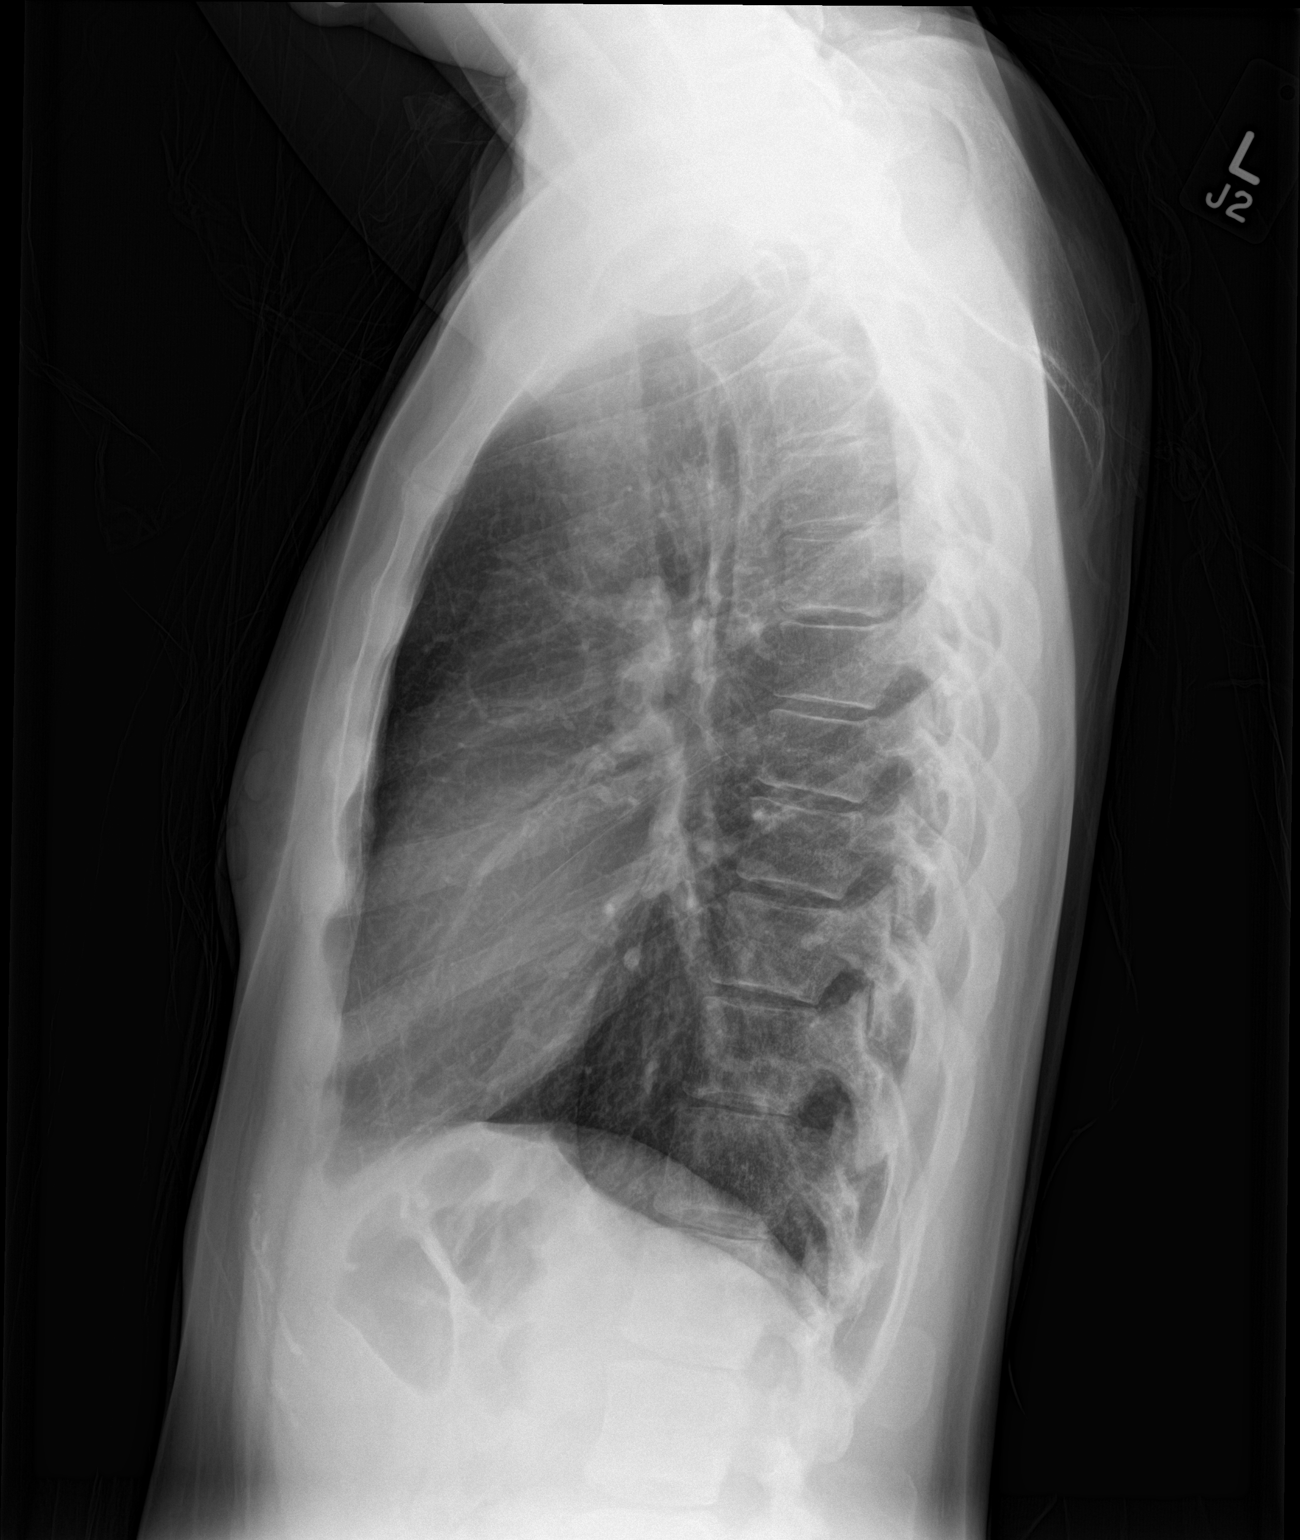

[2 of 2 positions shown; findings below may reference images not displayed]

FINDINGS: The heart size and mediastinal contours are within normal limits.
Both lungs are clear. The visualized skeletal structures are
unremarkable.
IMPRESSION: No active cardiopulmonary disease.

## 2016-06-19 ENCOUNTER — Emergency Department
Admission: EM | Admit: 2016-06-19 | Discharge: 2016-06-19 | Disposition: A | Discharge status: Home / Self Care | Payer: Medicaid Other | Attending: Emergency Medicine | Admitting: Emergency Medicine

## 2016-06-19 ENCOUNTER — Encounter: Payer: Self-pay | Admitting: Emergency Medicine

## 2016-06-19 DIAGNOSIS — H9202 Otalgia, left ear: Secondary | ICD-10-CM

## 2016-06-19 DIAGNOSIS — J029 Acute pharyngitis, unspecified: Secondary | ICD-10-CM | POA: Insufficient documentation

## 2016-06-19 DIAGNOSIS — J45909 Unspecified asthma, uncomplicated: Secondary | ICD-10-CM | POA: Insufficient documentation

## 2016-06-19 DIAGNOSIS — F17210 Nicotine dependence, cigarettes, uncomplicated: Secondary | ICD-10-CM | POA: Insufficient documentation

## 2016-06-19 LAB — POCT RAPID STREP A: Streptococcus, Group A Screen (Direct): NEGATIVE

## 2016-06-19 MED ORDER — IBUPROFEN 800 MG PO TABS: 800.0000 mg | ORAL_TABLET | Freq: Three times a day (TID) | ORAL | Status: AC | PRN

## 2016-06-19 MED ORDER — AMOXICILLIN 875 MG PO TABS: 875.0000 mg | ORAL_TABLET | Freq: Two times a day (BID) | ORAL | Status: AC

## 2016-06-19 NOTE — ED Provider Notes (Signed)
CSN: [id number]     Arrival date & time 06/19/16  1648 History   First MD Initiated Contact with Patient 06/19/16 1711     Chief Complaint  Patient presents with  . Ear Drainage     (Consider location/radiation/quality/duration/timing/severity/associated sxs/prior Treatment) HPI  35 year old female presents to the emergency department for evaluation of left ear and sore throat pain. Symptoms have been present for 3 days. She denies any fevers trauma or injury. No cough congestion or runny nose. Patient's pain is moderate,Located along the left anterior ear, left throat, with no swelling. She has had no improvement with Tylenol. Pain is increased with swallowing. She denies any drainage, headaches, nausea, vomiting, abdominal pain, neck pain.  Past Medical History  Diagnosis Date  . GERD (gastroesophageal reflux disease)   . Full dentures     upper and lower.  Doesn't wear.  . Asthma     as child. "outgrew"  . Tendonitis     elbows, hands  . Graves disease     Pt reports "fluctuates between hyper and hypo"   Past Surgical History  Procedure Laterality Date  . Multiple tooth extractions    . Dilation and curettage of uterus    . Esophagogastroduodenoscopy (egd) with propofol N/A 10/25/2015    Procedure: ESOPHAGOGASTRODUODENOSCOPY (EGD) WITH PROPOFOL;  Surgeon: Midge Miniumarren Wohl, MD;  [facility];  Service: Endoscopy;  Laterality: N/A;   Family History  Problem Relation Age of Onset  . Arthritis Mother   . Hypertension Mother    Social History  Substance Use Topics  . Smoking status: Current Every Day Smoker -- 0.50 packs/day for 20 years    Types: Cigarettes  . Smokeless tobacco: Never Used  . Alcohol Use: No   OB History    No data available     Review of Systems  Constitutional: Negative for fever, chills, activity change and fatigue.  HENT: Positive for ear pain and sore throat. Negative for congestion, ear discharge, hearing loss, rhinorrhea, sinus  pressure and trouble swallowing.   Eyes: Negative for visual disturbance.  Respiratory: Negative for cough, chest tightness and shortness of breath.   Cardiovascular: Negative for chest pain and leg swelling.  Gastrointestinal: Negative for nausea, vomiting, abdominal pain and diarrhea.  Genitourinary: Negative for dysuria.  Musculoskeletal: Negative for arthralgias and gait problem.  Skin: Negative for rash.  Neurological: Negative for weakness, numbness and headaches.  Hematological: Negative for adenopathy.  Psychiatric/Behavioral: Negative for behavioral problems, confusion and agitation.      Allergies  Review of patient's allergies indicates no known allergies.  Home Medications   Prior to Admission medications   Medication Sig Start Date End Date Taking? Authorizing Provider  amoxicillin (AMOXIL) 875 MG tablet Take 1 tablet (875 mg total) by mouth 2 (two) times daily. X 10 days 06/19/16   Evon Slackhomas C Gaines, PA-C  ibuprofen (ADVIL,MOTRIN) 800 MG tablet Take 1 tablet (800 mg total) by mouth every 8 (eight) hours as needed. 06/19/16   Evon Slackhomas C Gaines, PA-C  omeprazole (PRILOSEC) 40 MG capsule Take 1 capsule (40 mg total) by mouth daily. 10/11/15   Midge Miniumarren Wohl, MD   BP 122/64 mmHg  Pulse 75  Temp(Src) 98.1 F (36.7 C)  Resp 18  Ht 5\' 4"  (1.626 m)  Wt 61.236 kg  BMI 23.16 kg/m2  SpO2 99% Physical Exam  Constitutional: She is oriented to person, place, and time. She appears well-developed and well-nourished. No distress.  HENT:  Head: Normocephalic and atraumatic.  Right Ear:  Hearing, tympanic membrane, external ear and ear canal normal.  Left Ear: Hearing, tympanic membrane, external ear and ear canal normal.  Mouth/Throat: No trismus in the jaw. No dental abscesses or uvula swelling. Oropharyngeal exudate (left) present. No posterior oropharyngeal edema or tonsillar abscesses (uvula is midline.).  Eyes: EOM are normal. Pupils are equal, round, and reactive to light. Right eye  exhibits no discharge. Left eye exhibits no discharge.  Neck: Normal range of motion. Neck supple.  Cardiovascular: Normal rate, regular rhythm and intact distal pulses.   Pulmonary/Chest: Effort normal and breath sounds normal. No respiratory distress. She exhibits no tenderness.  Abdominal: Soft. She exhibits no distension and no mass. There is no tenderness. There is no rebound and no guarding.  Musculoskeletal: Normal range of motion. She exhibits no edema.  Lymphadenopathy:    She has cervical adenopathy (positive anterior cervical lymphadenopathy was no posterior cervical lymphadenopathy.).  Neurological: She is alert and oriented to person, place, and time. She has normal reflexes. No cranial nerve deficit. Coordination normal.  Skin: Skin is warm and dry. No rash noted.  Psychiatric: She has a normal mood and affect. Her behavior is normal. Thought content normal.    ED Course  Procedures (including critical care time) Labs Review Labs Reviewed  CULTURE, GROUP A STREP [facility])  POCT RAPID STREP A    Imaging Review No results found. I have personally reviewed and evaluated these images and lab results as part of my medical decision-making.   EKG Interpretation None      MDM   Final diagnoses:  Acute pharyngitis, unspecified pharyngitis type  Ear pain, left    35 year old female with pharyngitis and left ear pain, exam significant for anterior cervical lymphadenopathy, no posterior cervical lymphadenopathy and exudates along the left greater than right tonsil, no signs of peritonsillar or pharyngeal abscess. Patient is afebrile, vital signs are stable. We'll treat for strep pharyngitis, given a prescription for amoxicillin. Also given a prescription for ibuprofen. She is educated on signs and symptoms return to the ER for.    Evon Slack, PA-C 06/19/16 1752  Arnaldo Natal, MD 06/19/16 2231

## 2016-06-19 NOTE — Discharge Instructions (Signed)
Pharyngitis °Pharyngitis is redness, pain, and swelling (inflammation) of your pharynx.  °CAUSES  °Pharyngitis is usually caused by infection. Most of the time, these infections are from viruses (viral) and are part of a cold. However, sometimes pharyngitis is caused by bacteria (bacterial). Pharyngitis can also be caused by allergies. Viral pharyngitis may be spread from person to person by coughing, sneezing, and personal items or utensils (cups, forks, spoons, toothbrushes). Bacterial pharyngitis may be spread from person to person by more intimate contact, such as kissing.  °SIGNS AND SYMPTOMS  °Symptoms of pharyngitis include:   °· Sore throat.   °· Tiredness (fatigue).   °· Low-grade fever.   °· Headache. °· Joint pain and muscle aches. °· Skin rashes. °· Swollen lymph nodes. °· Plaque-like film on throat or tonsils (often seen with bacterial pharyngitis). °DIAGNOSIS  °Your health care provider will ask you questions about your illness and your symptoms. Your medical history, along with a physical exam, is often all that is needed to diagnose pharyngitis. Sometimes, a rapid strep test is done. Other lab tests may also be done, depending on the suspected cause.  °TREATMENT  °Viral pharyngitis will usually get better in 3-4 days without the use of medicine. Bacterial pharyngitis is treated with medicines that kill germs (antibiotics).  °HOME CARE INSTRUCTIONS  °· Drink enough water and fluids to keep your urine clear or pale yellow.   °· Only take over-the-counter or prescription medicines as directed by your health care provider:   °· If you are prescribed antibiotics, make sure you finish them even if you start to feel better.   °· Do not take aspirin.   °· Get lots of rest.   °· Gargle with 8 oz of salt water (½ tsp of salt per 1 qt of water) as often as every 1-2 hours to soothe your throat.   °· Throat lozenges (if you are not at risk for choking) or sprays may be used to soothe your throat. °SEEK MEDICAL  CARE IF:  °· You have large, tender lumps in your neck. °· You have a rash. °· You cough up green, yellow-brown, or bloody spit. °SEEK IMMEDIATE MEDICAL CARE IF:  °· Your neck becomes stiff. °· You drool or are unable to swallow liquids. °· You vomit or are unable to keep medicines or liquids down. °· You have severe pain that does not go away with the use of recommended medicines. °· You have trouble breathing (not caused by a stuffy nose). °MAKE SURE YOU:  °· Understand these instructions. °· Will watch your condition. °· Will get help right away if you are not doing well or get worse. °  °This information is not intended to replace advice given to you by your health care provider. Make sure you discuss any questions you have with your health care provider. °  °Document Released: 11/24/2005 Document Revised: 09/14/2013 Document Reviewed: 08/01/2013 °Elsevier Interactive Patient Education ©2016 [employer] ° °Earache °An earache, also called otalgia, can be caused by many things. Pain from an earache can be sharp, dull, or burning. The pain may be temporary or constant. °Earaches can be caused by problems with the ear, such as infection in either the middle ear or the ear canal, injury, impacted ear wax, middle ear pressure, or a foreign body in the ear. Ear pain can also result from problems in other areas. This is called referred pain. For example, pain can come from a sore throat, a tooth infection, or problems with the jaw or the joint between the jaw and the   skull (temporomandibular joint, or TMJ). The cause of an earache is not always easy to identify. Watchful waiting may be appropriate for some earaches until a clear cause of the pain can be found. HOME CARE INSTRUCTIONS Watch your condition for any changes. The following actions may help to lessen any discomfort that you are feeling:  Take medicines only as directed by your health care provider. This includes ear drops.  Apply ice to your outer  ear to help reduce pain.  Put ice in a plastic bag.  Place a towel between your skin and the bag.  Leave the ice on for 20 minutes, 2-3 times per day.  Do not put anything in your ear other than medicine that is prescribed by your health care provider.  Try resting in an upright position instead of lying down. This may help to reduce pressure in the middle ear and relieve pain.  Chew gum if it helps to relieve your ear pain.  Control any allergies that you have.  Keep all follow-up visits as directed by your health care provider. This is important. SEEK MEDICAL CARE IF:  Your pain does not improve within 2 days.  You have a fever.  You have new or worsening symptoms. SEEK IMMEDIATE MEDICAL CARE IF:  You have a severe headache.  You have a stiff neck.  You have difficulty swallowing.  You have redness or swelling behind your ear.  You have drainage from your ear.  You have hearing loss.  You feel dizzy.   This information is not intended to replace advice given to you by your health care provider. Make sure you discuss any questions you have with your health care provider.   Document Released: 07/11/2004 Document Revised: 12/15/2014 Document Reviewed: 06/25/2014 Elsevier Interactive Patient Education [employer]2016 [employer]   Patient will start amoxicillin, ibuprofen, increase fluids and return to the ER for any fevers, difficulty swallowing or increased pain.

## 2016-06-19 NOTE — ED Notes (Signed)
Left ear pain.

## 2016-06-22 LAB — CULTURE, GROUP A STREP (THRC)

## 2019-08-08 ENCOUNTER — Emergency Department
Admission: EM | Admit: 2019-08-08 | Discharge: 2019-08-09 | Disposition: A | Discharge status: Home / Self Care | Payer: Self-pay | Attending: Emergency Medicine | Admitting: Emergency Medicine

## 2019-08-08 ENCOUNTER — Emergency Department: Payer: Self-pay

## 2019-08-08 ENCOUNTER — Other Ambulatory Visit: Payer: Self-pay

## 2019-08-08 DIAGNOSIS — J45909 Unspecified asthma, uncomplicated: Secondary | ICD-10-CM | POA: Insufficient documentation

## 2019-08-08 DIAGNOSIS — M25552 Pain in left hip: Secondary | ICD-10-CM

## 2019-08-08 DIAGNOSIS — R1030 Lower abdominal pain, unspecified: Secondary | ICD-10-CM | POA: Insufficient documentation

## 2019-08-08 DIAGNOSIS — M7072 Other bursitis of hip, left hip: Secondary | ICD-10-CM | POA: Insufficient documentation

## 2019-08-08 DIAGNOSIS — F17210 Nicotine dependence, cigarettes, uncomplicated: Secondary | ICD-10-CM | POA: Insufficient documentation

## 2019-08-08 DIAGNOSIS — Z79899 Other long term (current) drug therapy: Secondary | ICD-10-CM | POA: Insufficient documentation

## 2019-08-08 DIAGNOSIS — Y9301 Activity, walking, marching and hiking: Secondary | ICD-10-CM | POA: Insufficient documentation

## 2019-08-08 NOTE — ED Triage Notes (Addendum)
Pt comes via pOV from home with c/o left sided hip pain. Pt also states pelvic pain. Pt states this started Saturday while at work. Pt denies any recent injury or fall.  Pt states when she arrived home she couldn't get out of her car. Pt able to bear weight but states 7/10 pain.  Pt denies any urinary symptoms.

## 2019-08-08 NOTE — ED Provider Notes (Signed)
[facility] Emergency Department Provider Note  ____________________________________________  Time seen: Approximately 10:35 PM  I have reviewed the triage vital signs and the nursing notes.   HISTORY  Chief Complaint Hip Pain    HPI Holly Reynolds is a 38 y.o. female who presents the emergency department complaining of left hip/groin pain.  Patient reports that several days ago she was at work, started to have pain to the anterior superior aspect of the femur.  Patient states that she walks a lot for her job and did not think anything of her symptoms.  Patient had 1/2-hour ride home when she got home she had pain to the point that she could not swing her leg out of the vehicle.  Patient is able to stand, ambulate on the leg but with any extension of her hip she has pain in the hip/groin region.  No direct trauma to the area.  Patient denies any GI or GU complaints.  No fevers or chills.  No history of same.  No medications prior to arrival.         Past Medical History:  Diagnosis Date  . Asthma    as child. "outgrew"  . Full dentures    upper and lower.  Doesn't wear.  Marland Kitchen GERD (gastroesophageal reflux disease)   . Graves disease    Pt reports "fluctuates between hyper and hypo"  . Tendonitis    elbows, hands    Patient Active Problem List   Diagnosis Date Noted  . Abdominal pain, epigastric   . Gastritis     Past Surgical History:  Procedure Laterality Date  . DILATION AND CURETTAGE OF UTERUS    . ESOPHAGOGASTRODUODENOSCOPY (EGD) WITH PROPOFOL N/A 10/25/2015   Procedure: ESOPHAGOGASTRODUODENOSCOPY (EGD) WITH PROPOFOL;  Surgeon: Midge Minium, MD;  Location: [facility];  Service: Endoscopy;  Laterality: N/A;  . MULTIPLE TOOTH EXTRACTIONS      Prior to Admission medications   Medication Sig Start Date End Date Taking? Authorizing Provider  amoxicillin (AMOXIL) 875 MG tablet Take 1 tablet (875 mg total) by mouth 2 (two) times daily. X  10 days 06/19/16   Evon Slack, PA-C  HYDROcodone-acetaminophen (NORCO/VICODIN) 5-325 MG tablet Take 1 tablet by mouth every 4 (four) hours as needed for moderate pain. 08/09/19   Rand Etchison, Delorise Royals, PA-C  ibuprofen (ADVIL,MOTRIN) 800 MG tablet Take 1 tablet (800 mg total) by mouth every 8 (eight) hours as needed. 06/19/16   Evon Slack, PA-C  meloxicam (MOBIC) 15 MG tablet Take 1 tablet (15 mg total) by mouth daily. 08/09/19   Lucus Lambertson, Delorise Royals, PA-C  omeprazole (PRILOSEC) 40 MG capsule Take 1 capsule (40 mg total) by mouth daily. 10/11/15   Midge Minium, MD    Allergies Patient has no known allergies.  Family History  Problem Relation Age of Onset  . Arthritis Mother   . Hypertension Mother     Social History Social History   Tobacco Use  . Smoking status: Current Every Day Smoker    Packs/day: 0.50    Years: 20.00    Pack years: 10.00    Types: Cigarettes  . Smokeless tobacco: Never Used  Substance Use Topics  . Alcohol use: No  . Drug use: No     Review of Systems  Constitutional: No fever/chills Eyes: No visual changes. No discharge ENT: No upper respiratory complaints. Cardiovascular: no chest pain. Respiratory: no cough. No SOB. Gastrointestinal: No abdominal pain.  No nausea, no vomiting.  No  diarrhea.  No constipation. Genitourinary: Negative for dysuria. No hematuria Musculoskeletal: Positive for left hip/groin pain Skin: Negative for rash, abrasions, lacerations, ecchymosis. Neurological: Negative for headaches, focal weakness or numbness. 10-point ROS otherwise negative.  ____________________________________________   PHYSICAL EXAM:  VITAL SIGNS: ED Triage Vitals  Enc Vitals Group     BP 08/08/19 2200 123/76     Pulse Rate 08/08/19 2200 90     Resp 08/08/19 2200 18     Temp 08/08/19 2200 98.9 F (37.2 C)     Temp src --      SpO2 08/08/19 2200 100 %     Weight 08/08/19 2159 135 lb (61.2 kg)     Height 08/08/19 2159 5\' 4"  (1.626 m)      Head Circumference --      Peak Flow --      Pain Score 08/08/19 2159 7     Pain Loc --      Pain Edu? --      Excl. in GC? --      Constitutional: Alert and oriented. Well appearing and in no acute distress. Eyes: Conjunctivae are normal. PERRL. EOMI. Head: Atraumatic.  Neck: No stridor.    Cardiovascular: Normal rate, regular rhythm. Normal S1 and S2.  Good peripheral circulation. Respiratory: Normal respiratory effort without tachypnea or retractions. Lungs CTAB. Good air entry to the bases with no decreased or absent breath sounds. Gastrointestinal: Bowel sounds 4 quadrants. Soft and nontender to palpation. No guarding or rigidity. No palpable masses. No distention. No CVA tenderness. Musculoskeletal: Full range of motion to all extremities. No gross deformities appreciated.  Visualization of the left hip reveals no visible signs of trauma.  No ecchymosis, edema, abrasions or lacerations.  Patient is able to internally and externally rotate her hip, flex the hip but she is unable to extend the hip due to pain.  Passive range of motion reveals full range of motion.  Palpation reveals mild tenderness to palpation of the greater trochanter region with more tenderness to palpation along the pubic rami.  No palpable abnormality in this region.  Palpation along the quadriceps reveals no deficits or tenderness.  Examination of the femur, knee, ankle is unremarkable.  Dorsalis pedis pulse intact distally.  Sensation intact distally. Neurologic:  Normal speech and language. No gross focal neurologic deficits are appreciated.  Skin:  Skin is warm, dry and intact. No rash noted. Psychiatric: Mood and affect are normal. Speech and behavior are normal. Patient exhibits appropriate insight and judgement.   ____________________________________________   LABS (all labs ordered are listed, but only abnormal results are displayed)  Labs Reviewed  POC URINE PREG, ED    ____________________________________________  EKG   ____________________________________________  RADIOLOGY I personally viewed and evaluated these images as part of my medical decision making, as well as reviewing the written report by the radiologist.  Dg Hip Unilat W Or Wo Pelvis 2-3 Views Left  Result Date: 08/09/2019 CLINICAL DATA:  Left hip and groin pain. No recent injury or fall. EXAM: DG HIP (WITH OR WITHOUT PELVIS) 2-3V LEFT COMPARISON:  None. FINDINGS: There is no evidence of hip fracture or dislocation. There is no evidence of hip arthropathy or other focal bone abnormality. Bowel gas pattern is normal. Few phleboliths in the pelvis. IMPRESSION: No acute osseous abnormality or significant degenerative features. Electronically Signed   By: Kreg ShropshirePrice  DeHay M.D.   On: 08/09/2019 00:20    ____________________________________________    PROCEDURES  Procedure(s) performed:    Procedures  Medications  meloxicam (MOBIC) tablet 15 mg (has no administration in time range)     ____________________________________________   INITIAL IMPRESSION / ASSESSMENT AND PLAN / ED COURSE  Pertinent labs & imaging results that were available during my care of the patient were reviewed by me and considered in my medical decision making (see chart for details).  Review of the [place] CSRS was performed in accordance of the [place] prior to dispensing any controlled drugs.           Patient's diagnosis is consistent with left hip pain, likely iliopsoas bursitis.  Patient presented to emergency department with left hip pain and groin pain.  Differential included nephrolithiasis, urinary tract infection, PID, ovarian torsion, colitis, diverticulitis, hernia, bursitis, stress fracture.  Patient is having no GI or GU complaints.  Exam is most consistent with musculoskeletal complaint.  Imaging revealed no acute fractures or dislocation.  Patient symptoms are most consistent with muscle strain  versus iliopsoas bursitis.  Patient will be started on anti-inflammatory and very limited pain medication.  If symptoms do not improve follow-up with orthopedics..  Patient is given ED precautions to return to the ED for any worsening or new symptoms.     ____________________________________________  FINAL CLINICAL IMPRESSION(S) / ED DIAGNOSES  Final diagnoses:  Left hip pain  Iliopsoas bursitis of left hip      NEW MEDICATIONS STARTED DURING THIS VISIT:  ED Discharge Orders         Ordered    meloxicam (MOBIC) 15 MG tablet  Daily     08/09/19 0029    HYDROcodone-acetaminophen (NORCO/VICODIN) 5-325 MG tablet  Every 4 hours PRN     08/09/19 0029              This chart was dictated using voice recognition software/Dragon. Despite best efforts to proofread, errors can occur which can change the meaning. Any change was purely unintentional.    Darletta Moll, PA-C 08/09/19 0032    Blake Divine, MD 08/09/19 [phone]

## 2019-08-09 LAB — POCT PREGNANCY, URINE: Preg Test, Ur: NEGATIVE

## 2019-08-09 MED ORDER — MELOXICAM 7.5 MG PO TABS
15.0000 mg | ORAL_TABLET | Freq: Once | ORAL | Status: AC
  Administered 2019-08-09: 15 mg via ORAL
  Filled 2019-08-09: qty 2

## 2019-08-09 MED ORDER — MELOXICAM 15 MG PO TABS: 15.0000 mg | ORAL_TABLET | Freq: Every day | ORAL | 0 refills | Status: AC

## 2019-08-09 MED ORDER — HYDROCODONE-ACETAMINOPHEN 5-325 MG PO TABS: 1.0000 | ORAL_TABLET | ORAL | 0 refills | Status: AC | PRN
